# Patient Record
Sex: Female | Born: 1970 | Race: Black or African American | Hispanic: No | Marital: Single | Smoking: Never smoker
Health system: Southern US, Community
[De-identification: ages and names within clinical notes are randomized; demographics above are authoritative.]

## PROBLEM LIST (undated history)

## (undated) DIAGNOSIS — G35 Multiple sclerosis: Secondary | ICD-10-CM

---

## 2020-08-03 ENCOUNTER — Emergency Department (HOSPITAL_COMMUNITY)
Admission: EM | Admit: 2020-08-03 | Discharge: 2020-08-04 | Disposition: A | Discharge status: Home / Self Care | Payer: Medicare Other | Attending: Emergency Medicine | Admitting: Emergency Medicine

## 2020-08-03 ENCOUNTER — Encounter (HOSPITAL_COMMUNITY): Payer: Self-pay | Admitting: Emergency Medicine

## 2020-08-03 ENCOUNTER — Other Ambulatory Visit: Payer: Self-pay

## 2020-08-03 DIAGNOSIS — R110 Nausea: Secondary | ICD-10-CM | POA: Diagnosis present

## 2020-08-03 DIAGNOSIS — R42 Dizziness and giddiness: Secondary | ICD-10-CM | POA: Diagnosis not present

## 2020-08-03 DIAGNOSIS — R200 Anesthesia of skin: Secondary | ICD-10-CM

## 2020-08-03 DIAGNOSIS — G35 Multiple sclerosis: Secondary | ICD-10-CM | POA: Diagnosis not present

## 2020-08-03 HISTORY — DX: Multiple sclerosis: G35

## 2020-08-03 NOTE — ED Triage Notes (Signed)
Patient complaining of nausea, numbness in legs, and dizziness that started today. Patient brought in by [facility].

## 2020-08-04 ENCOUNTER — Encounter (HOSPITAL_COMMUNITY): Payer: Self-pay | Admitting: Emergency Medicine

## 2020-08-04 ENCOUNTER — Emergency Department (HOSPITAL_COMMUNITY): Payer: Medicare Other

## 2020-08-04 LAB — COMPREHENSIVE METABOLIC PANEL
ALT: 19 U/L (ref 0–44)
AST: 26 U/L (ref 15–41)
Albumin: 3.9 g/dL (ref 3.5–5.0)
Alkaline Phosphatase: 93 U/L (ref 38–126)
Anion gap: 9 (ref 5–15)
BUN: 10 mg/dL (ref 6–20)
CO2: 25 mmol/L (ref 22–32)
Calcium: 8.8 mg/dL — ABNORMAL LOW (ref 8.9–10.3)
Chloride: 104 mmol/L (ref 98–111)
Creatinine, Ser: 0.91 mg/dL (ref 0.44–1.00)
GFR, Estimated: >60 mL/min (ref >60)
Glucose, Bld: 74 mg/dL (ref 70–99)
Potassium: 3.2 mmol/L — ABNORMAL LOW (ref 3.5–5.1)
Sodium: 138 mmol/L (ref 135–145)
Total Bilirubin: 0.8 mg/dL (ref 0.3–1.2)
Total Protein: 7.2 g/dL (ref 6.5–8.1)

## 2020-08-04 LAB — CBC
HCT: 38.8 % (ref 36.0–46.0)
Hemoglobin: 12.2 g/dL (ref 12.0–15.0)
MCH: 28.9 pg (ref 26.0–34.0)
MCHC: 31.4 g/dL (ref 30.0–36.0)
MCV: 91.9 fL (ref 80.0–100.0)
Platelets: 223 10*3/uL (ref 150–400)
RBC: 4.22 MIL/uL (ref 3.87–5.11)
RDW: 13.6 % (ref 11.5–15.5)
WBC: 4.6 10*3/uL (ref 4.0–10.5)
nRBC: 0 % (ref 0.0–0.2)

## 2020-08-04 LAB — URINALYSIS, ROUTINE W REFLEX MICROSCOPIC
Bilirubin Urine: NEGATIVE
Glucose, UA: NEGATIVE mg/dL
Hgb urine dipstick: NEGATIVE
Ketones, ur: 5 mg/dL — AB
Leukocytes,Ua: NEGATIVE
Nitrite: NEGATIVE
Protein, ur: NEGATIVE mg/dL
Specific Gravity, Urine: 1.008 (ref 1.005–1.030)
pH: 6 (ref 5.0–8.0)

## 2020-08-04 LAB — HCG, QUANTITATIVE, PREGNANCY: hCG, Beta Chain, Quant, S: 3 m[IU]/mL (ref <5)

## 2020-08-04 IMAGING — MR MR HEAD WO/W CM
10 series · 48 of 48 positions shown · IV contrast (gadavist)
Comparison: Cervical spine the same day reported separately.

CLINICAL DATA: 49-year-old female with a history of multiple
sclerosis, visiting from out of town. Dizziness. Bilateral lower
extremity numbness and nausea.

EXAM:
MRI HEAD WITHOUT AND WITH CONTRAST
TECHNIQUE: Multiplanar, multiecho pulse sequences of the brain and surrounding
structures were obtained without and with intravenous contrast.
CONTRAST:  8mL GADAVIST GADOBUTROL 1 MMOL/ML IV SOLN in conjunction
with contrast enhanced imaging of the cervical spine reported
separately.

[Series 9: DWI · axial · 3.0mm · 1.36mm/px · z∈[-95,+57]mm · 9 of 104 slices shown (1 of 2)]
[im 1/104]
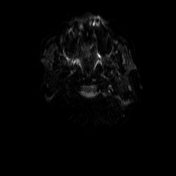
[im 13/104]
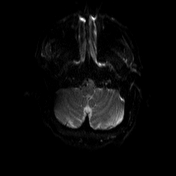
[im 26/104]
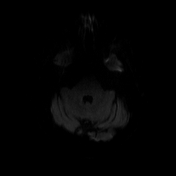
[im 39/104]
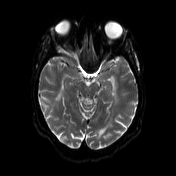
[im 52/104]
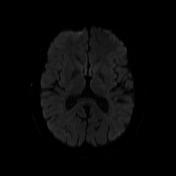
[im 65/104]
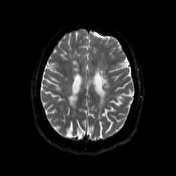
[im 78/104]
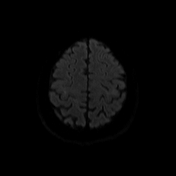
[im 91/104]
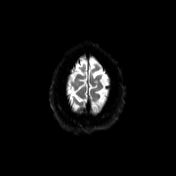
[im 104/104]
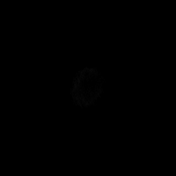

[Series 10: DWI · axial · 3.0mm · 1.36mm/px · z∈[-95,+57]mm · 4 of 52 slices shown (2 of 2)]
[im 1/52]
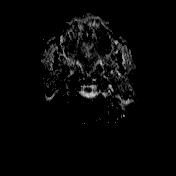
[im 18/52]
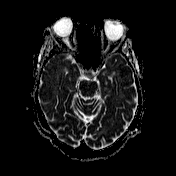
[im 35/52]
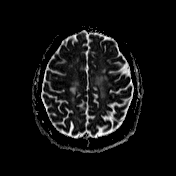
[im 52/52]
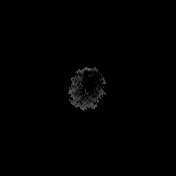

[Series 11: T1 · sagittal · 5.0mm · 0.75mm/px · 2 of 26 slices shown (1 of 2)]
[im 1/26]
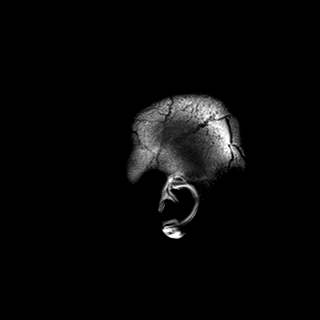
[im 26/26]
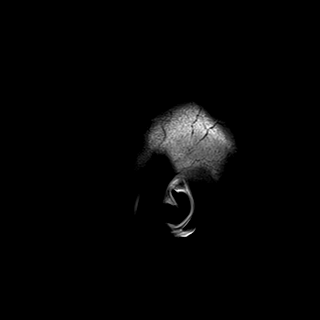

[Series 12: T2 · axial · 5.0mm · 0.62mm/px · z∈[-84,+65]mm · 2 of 24 slices shown (1 of 2)]
[im 1/24]
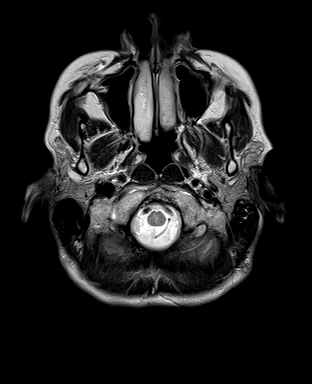
[im 24/24]
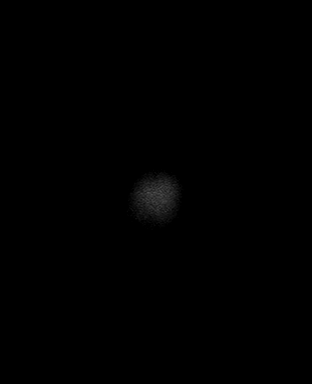

[Series 13: swi_images · axial · 3.0mm · 0.75mm/px · z∈[-86,+67]mm · 4 of 52 slices shown]
[im 1/52]
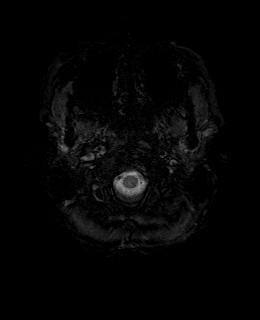
[im 18/52]
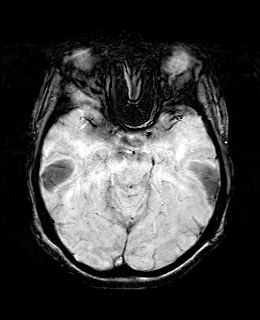
[im 35/52]
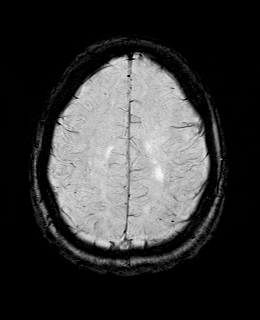
[im 52/52]
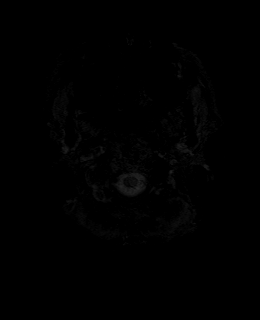

[Series 15: FLAIR · axial · 3.0mm · 0.75mm/px · z∈[-99,+59]mm · 4 of 54 slices shown]
[im 1/54]
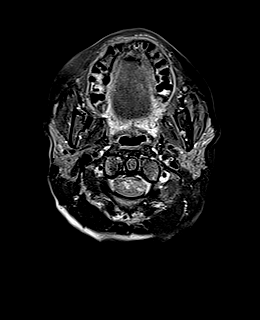
[im 18/54]
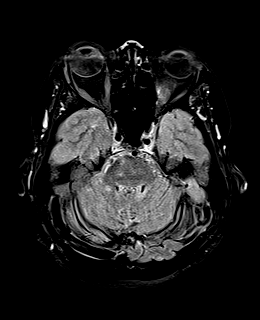
[im 36/54]
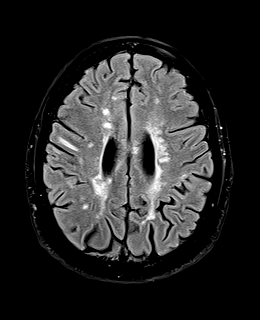
[im 54/54]
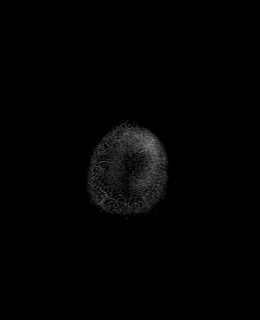

[Series 16: T1 · axial · 1.0mm · 0.94mm/px · z∈[-94,+64]mm · 13 of 160 slices shown (2 of 2)]
[im 1/160]
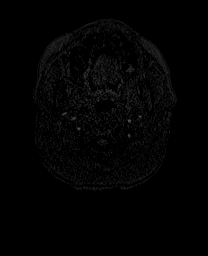
[im 14/160]
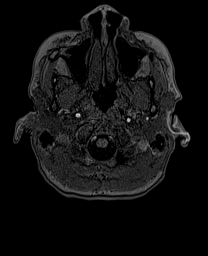
[im 27/160]
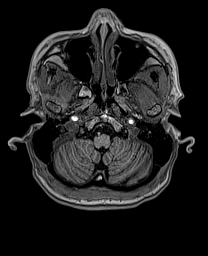
[im 40/160]
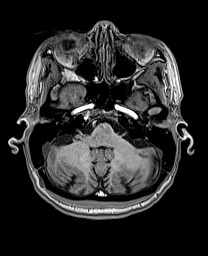
[im 54/160]
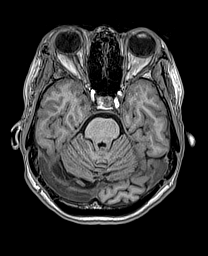
[im 67/160]
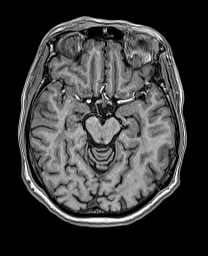
[im 80/160]
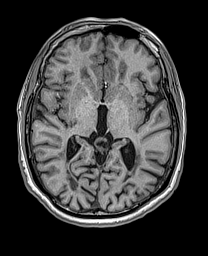
[im 93/160]
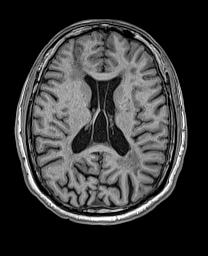
[im 107/160]
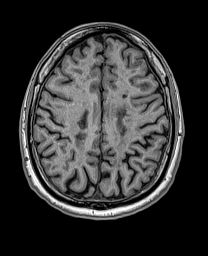
[im 120/160]
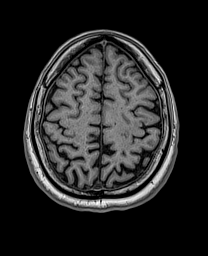
[im 133/160]
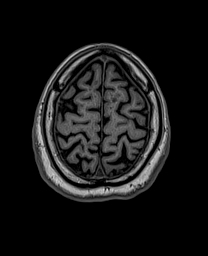
[im 146/160]
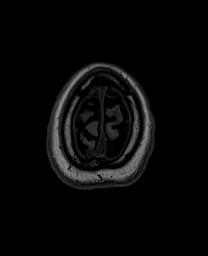
[im 160/160]
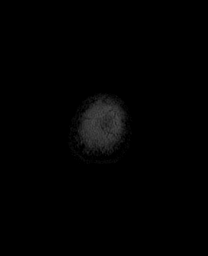

[Series 17: cor dwi_tracew · coronal · 5.0mm · 1.53mm/px · 5 of 56 slices shown]
[im 1/56]
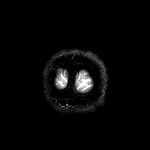
[im 14/56]
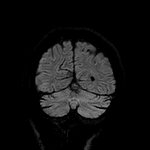
[im 28/56]
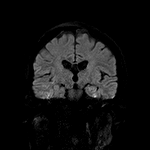
[im 42/56]
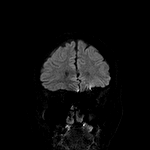
[im 56/56]
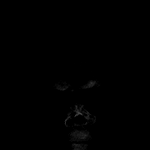

[Series 18: cor dwi_adc · coronal · 5.0mm · 1.53mm/px · 2 of 28 slices shown]
[im 1/28]
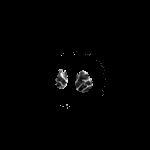
[im 28/28]
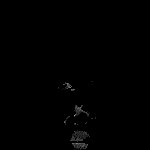

[Series 19: T2 · coronal · 5.0mm · 0.57mm/px · 3 of 37 slices shown (2 of 2)]
[im 1/37]
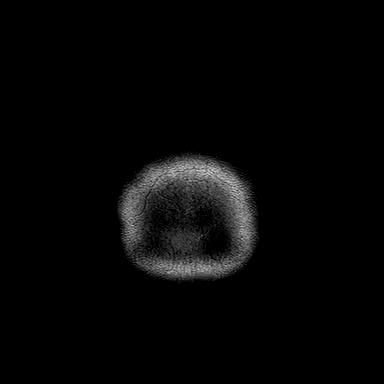
[im 19/37]
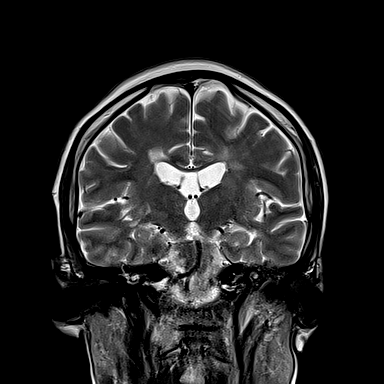
[im 37/37]
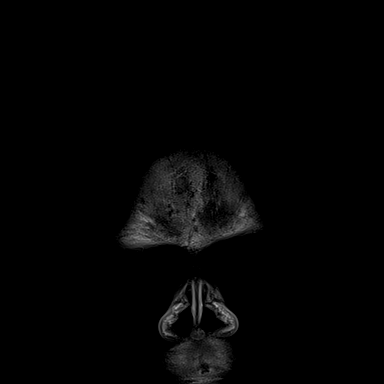

[48 of 48 positions shown; findings below may reference images not displayed]

FINDINGS: Brain: Cerebral white matter volume loss including throughout the
corpus callosum (series 11, image 13). Extensive bilateral cerebral
white matter T2 and FLAIR hyperintensity, confluent in the
periventricular regions but also involving scattered subcortical
white matter, and there is occasional direct involvement of the
cortex (series 15, image 43 left posterior frontal lobe).
Heterogeneous involvement of the left deep gray matter nuclei.
Heterogeneous involvement of the brainstem, also more pronounced on
the left. Patchy cerebellar involvement also greater on the left and
involving some of the left cerebellar peduncles.

Occasional faint T2 shine through on diffusion. No diffusion
restricted lesion. No abnormal enhancement identified.

Cavum septum pellucidum, normal variant. No superimposed restricted
diffusion suggestive of acute infarction. No midline shift, mass
effect, evidence of mass lesion, ventriculomegaly, extra-axial
collection or acute intracranial hemorrhage. Cervicomedullary
junction and pituitary are within normal limits. No chronic cerebral
blood products identified. No dural thickening.

Vascular: Major intracranial vascular flow voids are preserved. The
major dural venous sinuses are enhancing and appear to be patent.

Skull and upper cervical spine: Visualized bone marrow signal is
within normal limits. Cervical spine detailed separately.

Sinuses/Orbits: Grossly symmetric orbits. Paranasal sinuses are
clear.

Other: Trace mastoid fluid on the left. Negative visible
nasopharynx. Negative visible scalp and face.
IMPRESSION: 1. Advanced chronic demyelinating disease. No active demyelination
or other acute intracranial abnormality identified.
2. Cervical Spine MRI reported separately.

## 2020-08-04 IMAGING — CT CT HEAD W/O CM
3 of 9 series · 15 of 47 positions shown, 18 images · non-contrast
Comparison: None.

CLINICAL DATA: Multiple sclerosis. Patient complaining of nausea,
numbness in legs, and dizziness that started today. Patient brought
in by [REDACTED] Repeats scans for pt motion.

EXAM:
CT HEAD WITHOUT CONTRAST
TECHNIQUE: Contiguous axial images were obtained from the base of the skull
through the vertex without intravenous contrast.

[Series 4: head wo · axial · 0.47mm/px · z∈[+1566,+1700]mm · 12 of 33 slices shown, 15 images]
[im 3/33  brain]
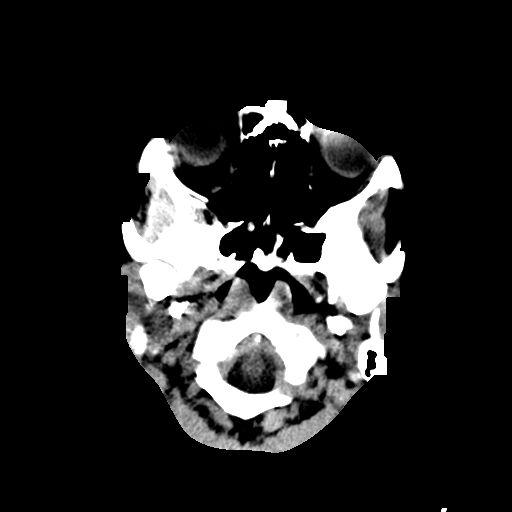
[im 3/33  bone]
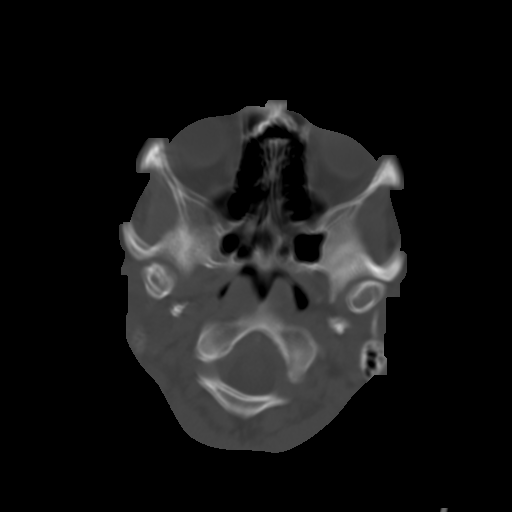
[im 5/33  brain]
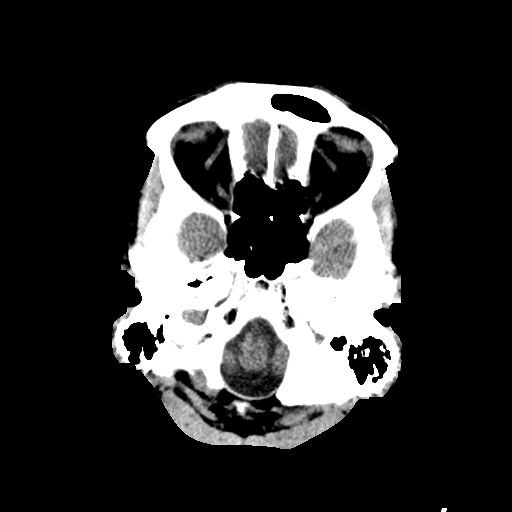
[im 7/33  brain]
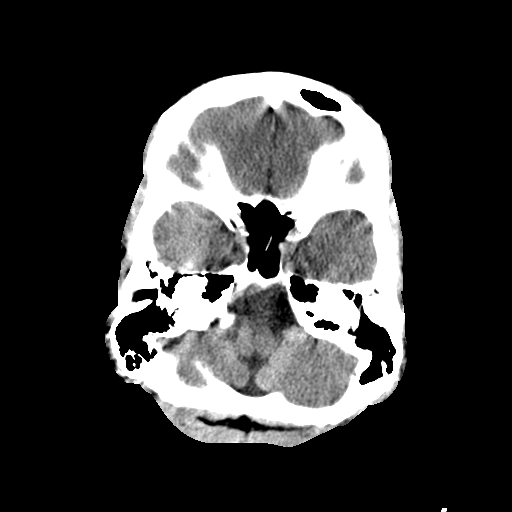
[im 11/33  brain]
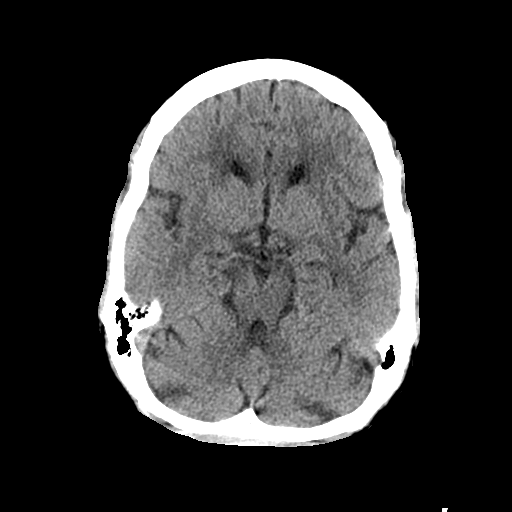
[im 13/33  brain]
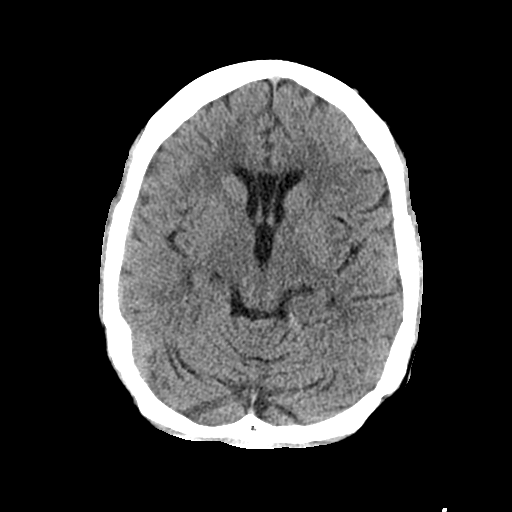
[im 13/33  bone]
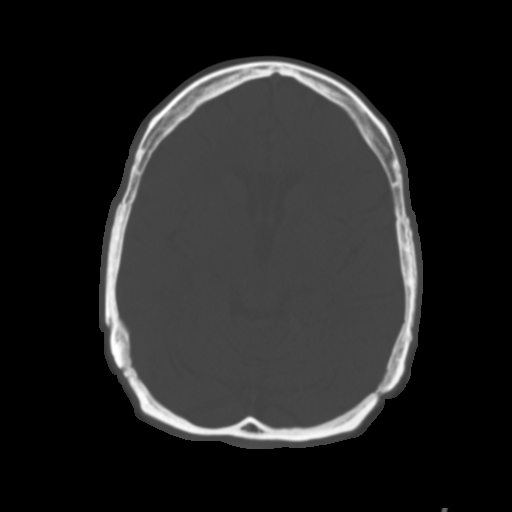
[im 15/33  brain]
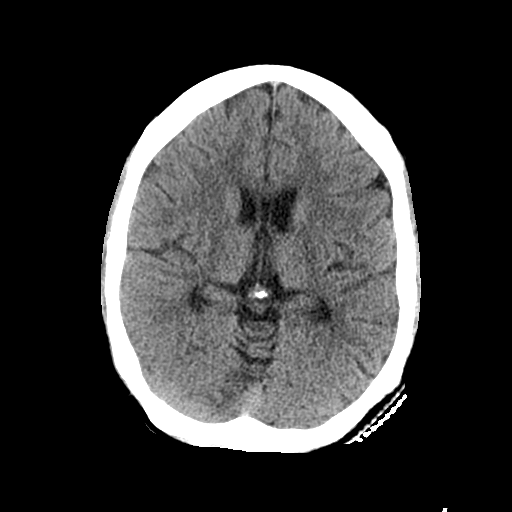
[im 18/33  brain]
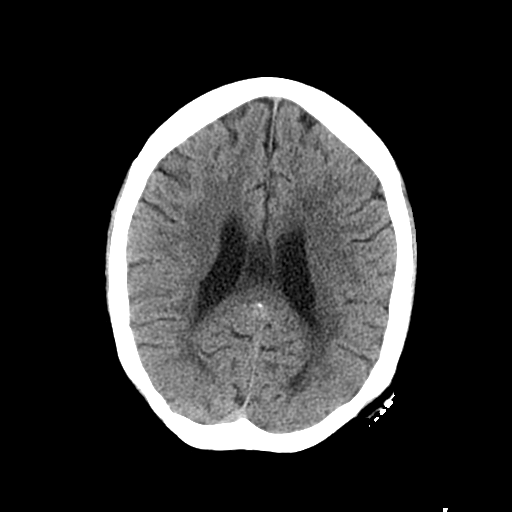
[im 20/33  brain]
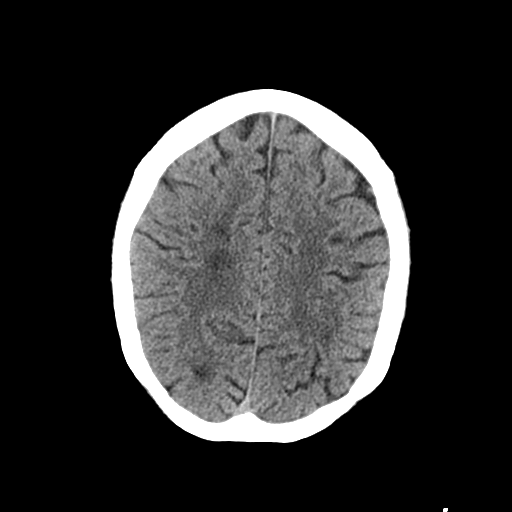
[im 22/33  brain]
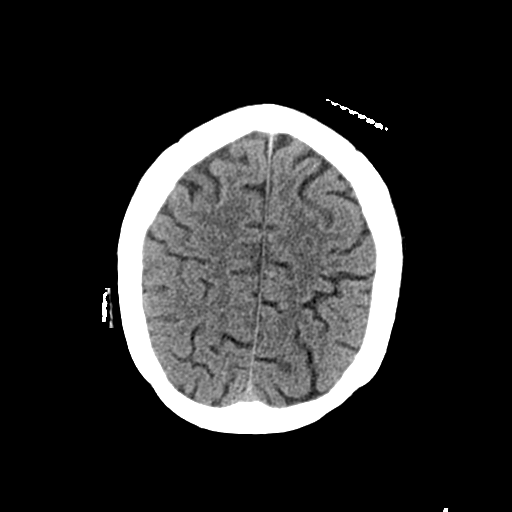
[im 22/33  bone]
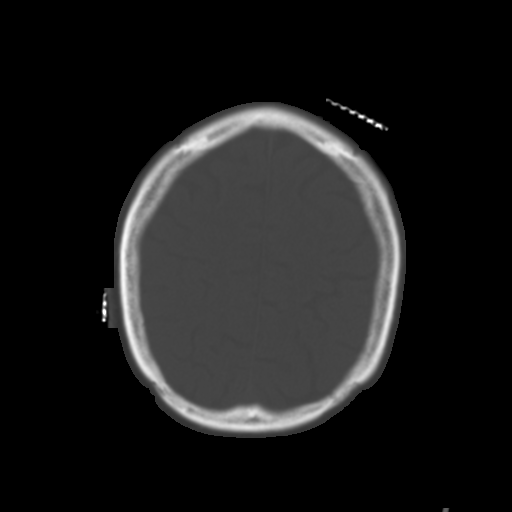
[im 26/33  brain]
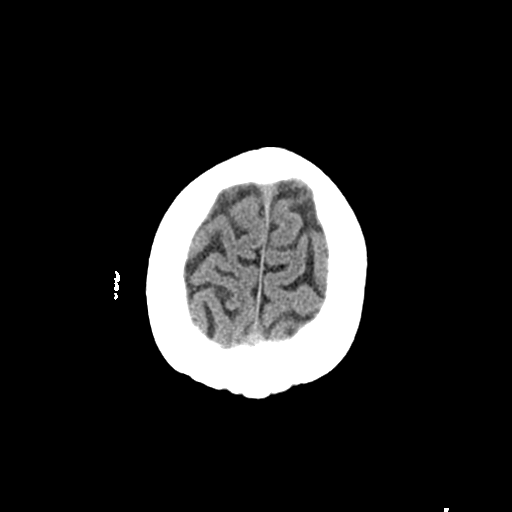
[im 28/33  brain]
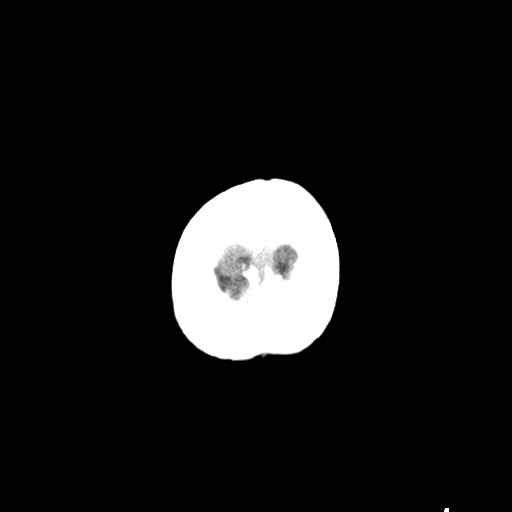
[im 30/33  brain]
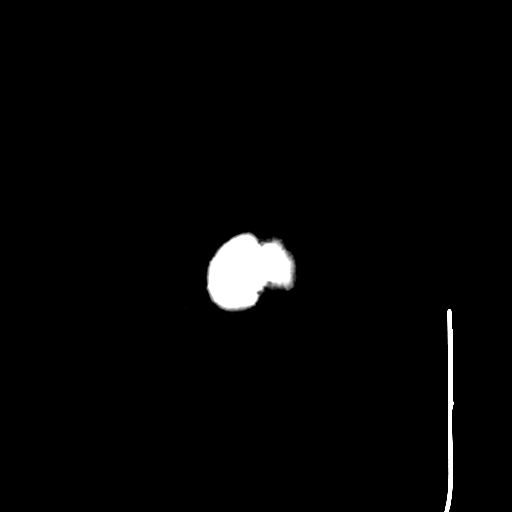

[Series 11: coronal soft tissue · coronal · 0.34mm/px · 2 of 70 slices shown]
[im 24/70  brain]
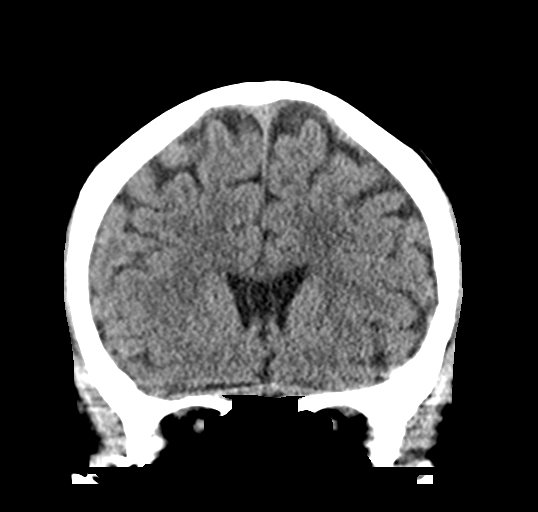
[im 47/70  brain]
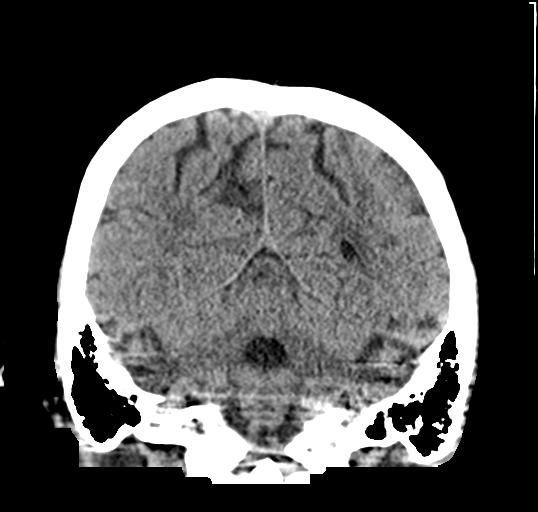

[Series 12: sagittal soft tissue · sagittal · 0.34mm/px · 1 of 61 slices shown]
[im 31/61  brain]
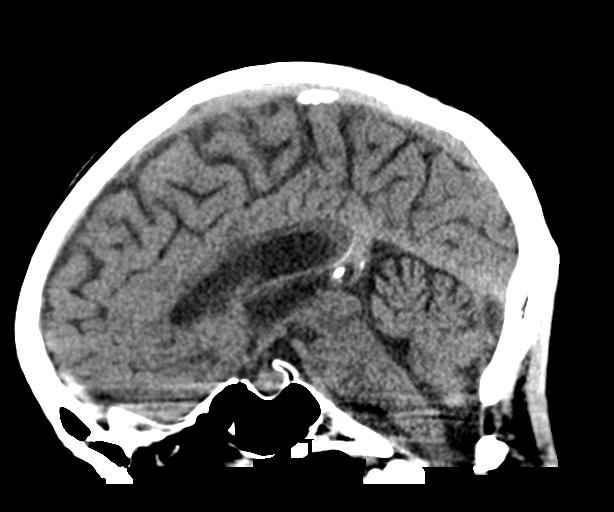

[15 of 47 positions shown; findings below may reference images not displayed]

FINDINGS: Brain:

Patchy areas of decreased attenuation are noted along
periventricular white matter of the cerebral hemispheres
bilaterally.

No evidence of large-territorial acute infarction. No parenchymal
hemorrhage. No mass lesion. No extra-axial collection. Cavum septum
pellucidum variant.

No mass effect or midline shift. No hydrocephalus. Basilar cisterns
are patent.

Vascular: No hyperdense vessel.

Skull: No acute fracture or focal lesion.

Sinuses/Orbits: Paranasal sinuses and mastoid air cells are clear.
The orbits are unremarkable.

Other: None.
IMPRESSION: No acute intracranial abnormality.

## 2020-08-04 IMAGING — MR MR HEAD WO/W CM
4 series · 48 of 48 positions shown · IV contrast (gadavist)
Comparison: Cervical spine the same day reported separately.

CLINICAL DATA: 49-year-old female with a history of multiple
sclerosis, visiting from out of town. Dizziness. Bilateral lower
extremity numbness and nausea.

EXAM:
MRI HEAD WITHOUT AND WITH CONTRAST
TECHNIQUE: Multiplanar, multiecho pulse sequences of the brain and surrounding
structures were obtained without and with intravenous contrast.
CONTRAST:  8mL GADAVIST GADOBUTROL 1 MMOL/ML IV SOLN in conjunction
with contrast enhanced imaging of the cervical spine reported
separately.

[Series 5: T2 post-contrast · coronal · 5.0mm · 0.57mm/px · 6 of 29 slices shown]
[im 1/29]
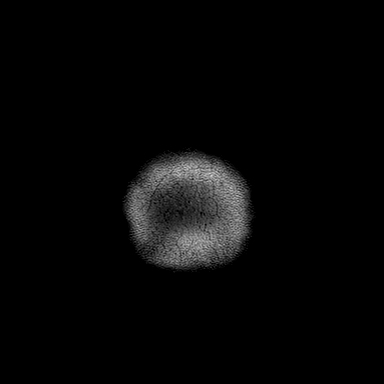
[im 6/29]
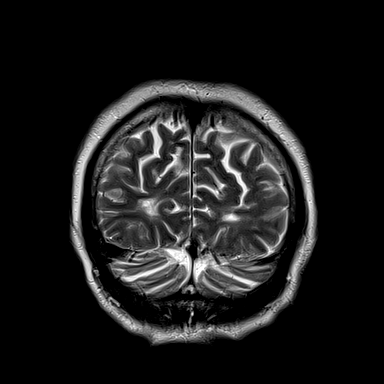
[im 12/29]
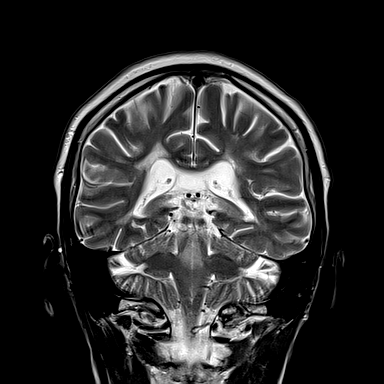
[im 17/29]
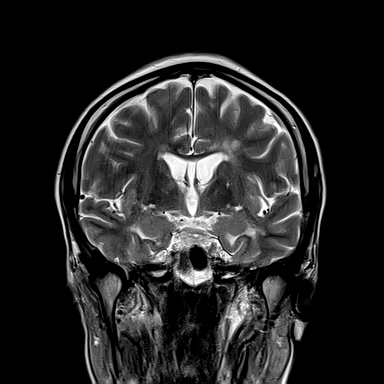
[im 23/29]
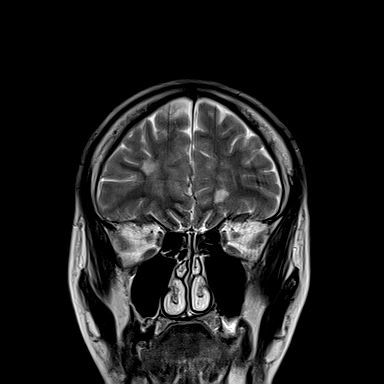
[im 29/29]
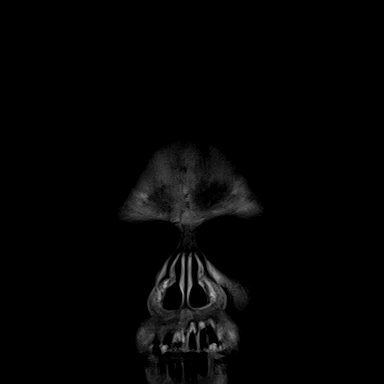

[Series 6: T1 post-contrast · axial · 1.0mm · 0.94mm/px · z∈[-99,+59]mm · 31 of 160 slices shown (1 of 3)]
[im 1/160]
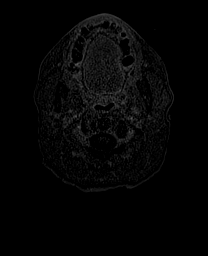
[im 6/160]
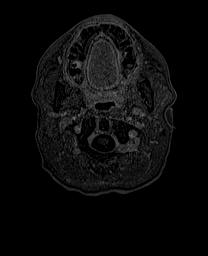
[im 11/160]
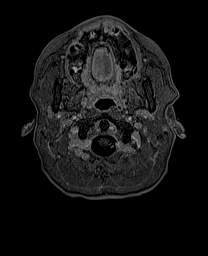
[im 16/160]
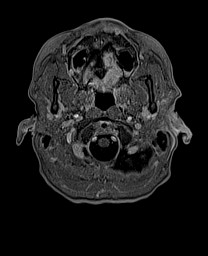
[im 22/160]
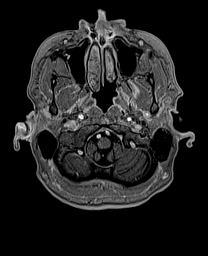
[im 27/160]
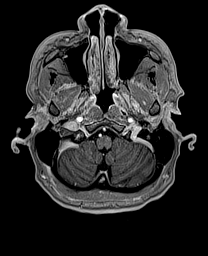
[im 32/160]
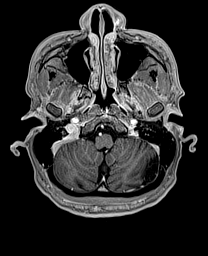
[im 38/160]
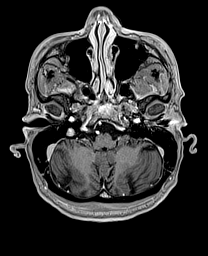
[im 43/160]
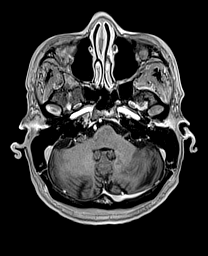
[im 48/160]
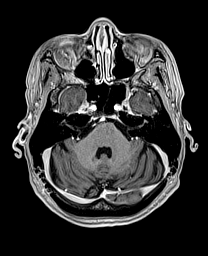
[im 54/160]
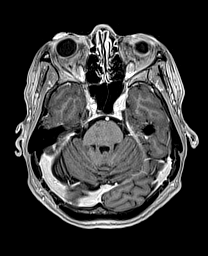
[im 59/160]
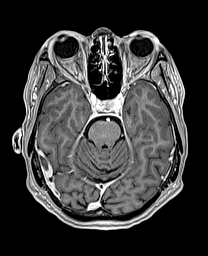
[im 64/160]
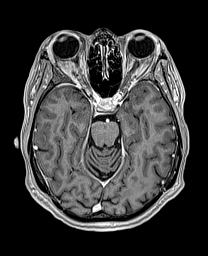
[im 69/160]
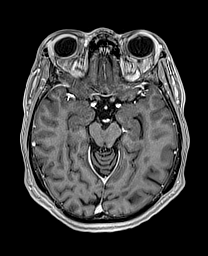
[im 75/160]
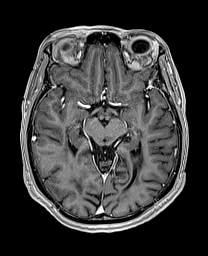
[im 80/160]
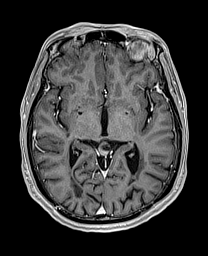
[im 85/160]
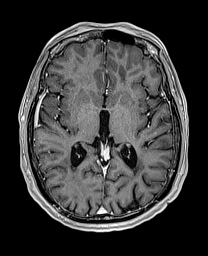
[im 91/160]
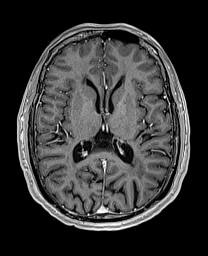
[im 96/160]
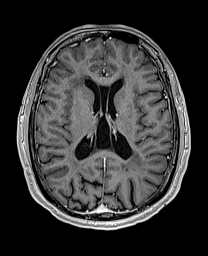
[im 101/160]
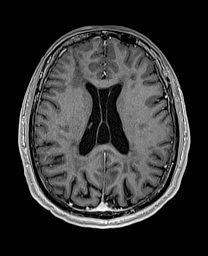
[im 107/160]
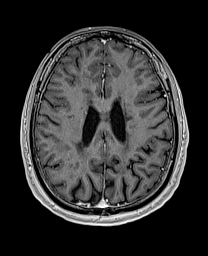
[im 112/160]
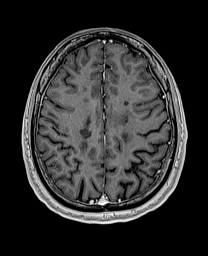
[im 117/160]
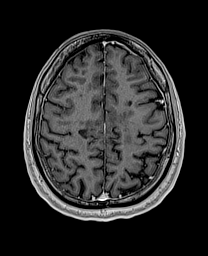
[im 122/160]
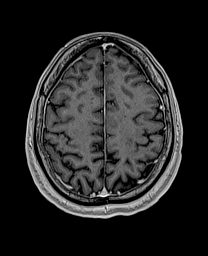
[im 128/160]
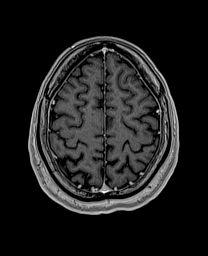
[im 133/160]
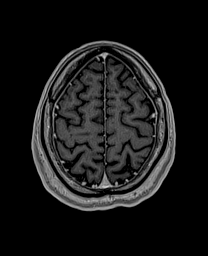
[im 138/160]
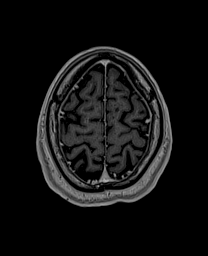
[im 144/160]
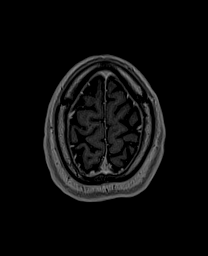
[im 149/160]
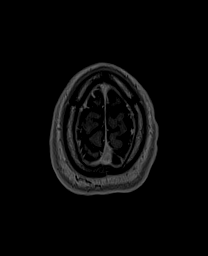
[im 154/160]
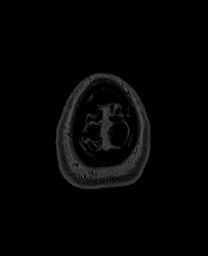
[im 160/160]
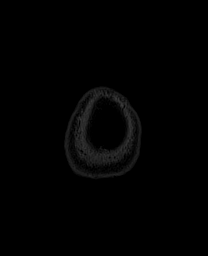

[Series 7: T1 post-contrast · coronal · 5.0mm · 0.43mm/px · 6 of 29 slices shown (2 of 3)]
[im 1/29]
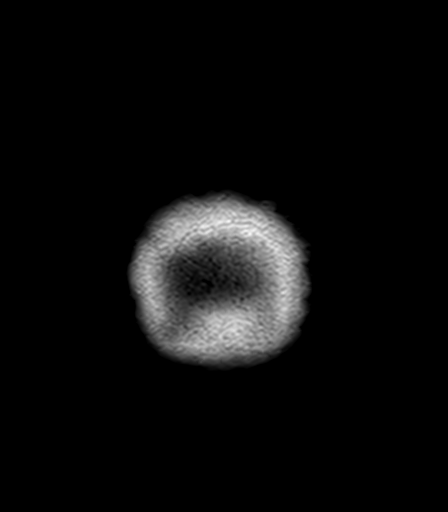
[im 6/29]
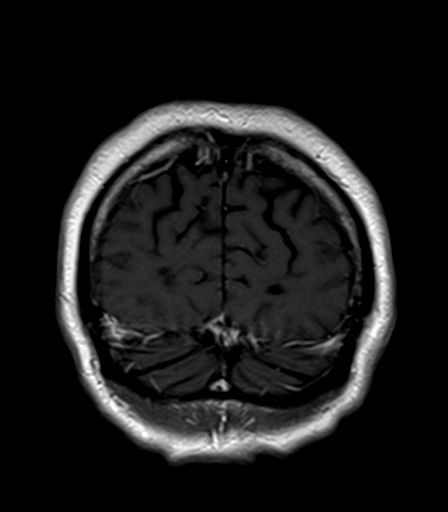
[im 12/29]
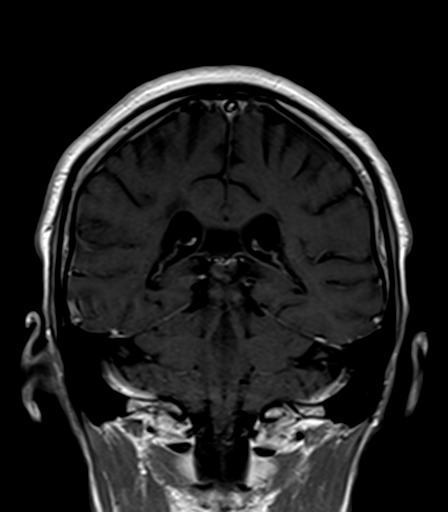
[im 17/29]
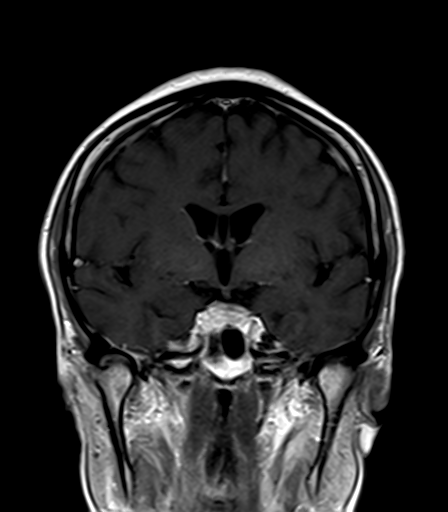
[im 23/29]
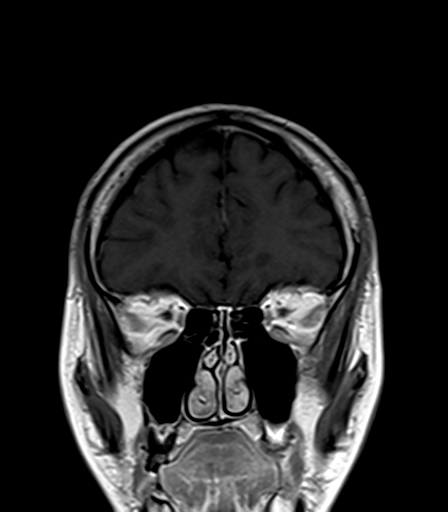
[im 29/29]
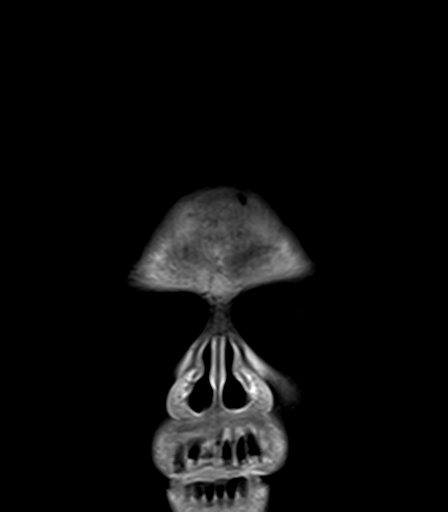

[Series 8: T1 post-contrast · sagittal · 5.0mm · 0.75mm/px · 5 of 26 slices shown (3 of 3)]
[im 1/26]
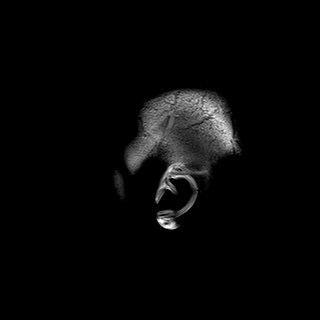
[im 7/26]
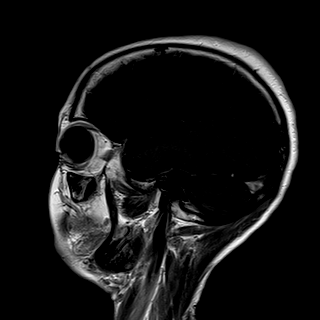
[im 13/26]
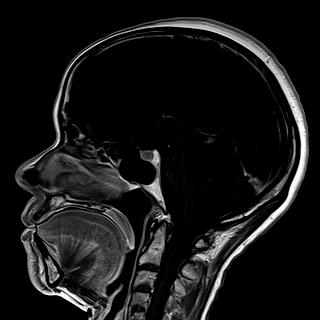
[im 19/26]
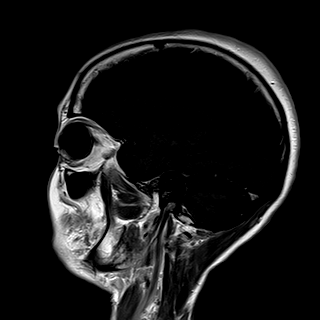
[im 26/26]
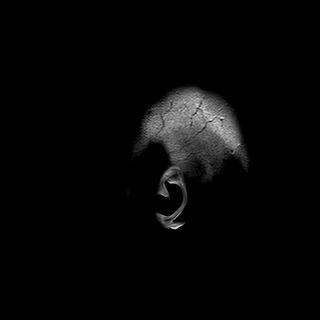

[48 of 48 positions shown; findings below may reference images not displayed]

FINDINGS: Brain: Cerebral white matter volume loss including throughout the
corpus callosum (series 11, image 13). Extensive bilateral cerebral
white matter T2 and FLAIR hyperintensity, confluent in the
periventricular regions but also involving scattered subcortical
white matter, and there is occasional direct involvement of the
cortex (series 15, image 43 left posterior frontal lobe).
Heterogeneous involvement of the left deep gray matter nuclei.
Heterogeneous involvement of the brainstem, also more pronounced on
the left. Patchy cerebellar involvement also greater on the left and
involving some of the left cerebellar peduncles.

Occasional faint T2 shine through on diffusion. No diffusion
restricted lesion. No abnormal enhancement identified.

Cavum septum pellucidum, normal variant. No superimposed restricted
diffusion suggestive of acute infarction. No midline shift, mass
effect, evidence of mass lesion, ventriculomegaly, extra-axial
collection or acute intracranial hemorrhage. Cervicomedullary
junction and pituitary are within normal limits. No chronic cerebral
blood products identified. No dural thickening.

Vascular: Major intracranial vascular flow voids are preserved. The
major dural venous sinuses are enhancing and appear to be patent.

Skull and upper cervical spine: Visualized bone marrow signal is
within normal limits. Cervical spine detailed separately.

Sinuses/Orbits: Grossly symmetric orbits. Paranasal sinuses are
clear.

Other: Trace mastoid fluid on the left. Negative visible
nasopharynx. Negative visible scalp and face.
IMPRESSION: 1. Advanced chronic demyelinating disease. No active demyelination
or other acute intracranial abnormality identified.
2. Cervical Spine MRI reported separately.

## 2020-08-04 IMAGING — MR MR CERVICAL SPINE WO/W CM
7 of 8 series · 33 of 48 positions shown · IV contrast (gadavist)
Comparison: None.

CLINICAL DATA: 49-year-old female with a history of multiple
sclerosis, visiting from out of town. Dizziness. Bilateral lower
extremity numbness and nausea.

EXAM:
MRI CERVICAL SPINE WITHOUT AND WITH CONTRAST
TECHNIQUE: Multiplanar and multiecho pulse sequences of the cervical spine, to
include the craniocervical junction and cervicothoracic junction,
were obtained without and with intravenous contrast.
CONTRAST:  8mL GADAVIST GADOBUTROL 1 MMOL/ML IV SOLN

[Series 5: T1 · sagittal · 3.0mm · 0.69mm/px · 4 of 15 slices shown (1 of 2)]
[im 1/15]
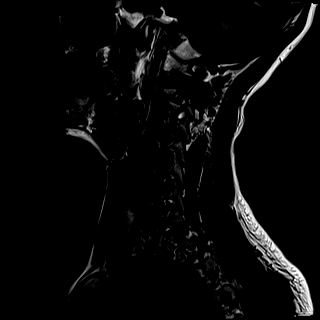
[im 5/15]
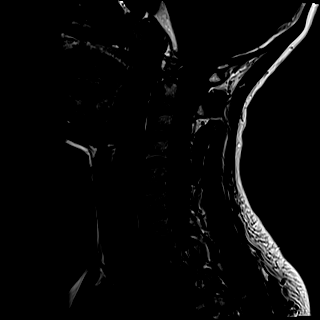
[im 10/15]
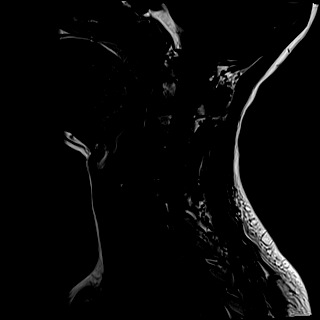
[im 15/15]
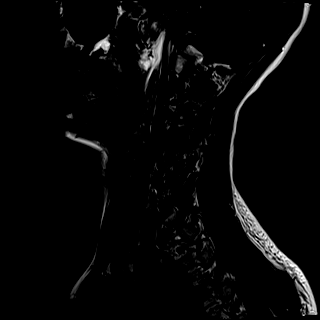

[Series 6: STIR · sagittal · 3.0mm · 0.86mm/px · 4 of 15 slices shown]
[im 1/15]
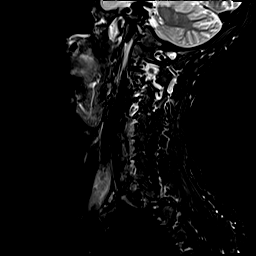
[im 5/15]
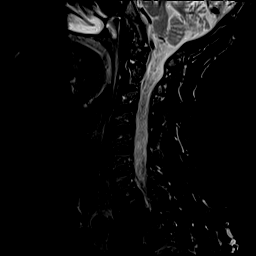
[im 10/15]
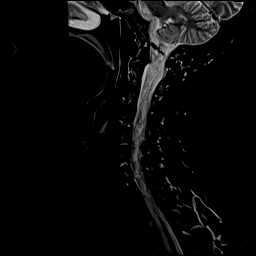
[im 15/15]
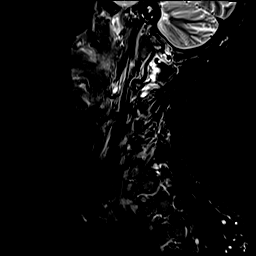

[Series 7: T2 · axial · 3.0mm · 0.70mm/px · z∈[-59,+57]mm · 8 of 34 slices shown]
[im 1/34]
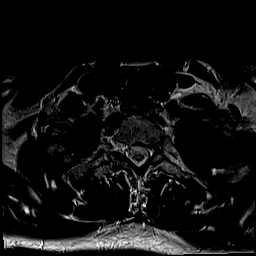
[im 5/34]
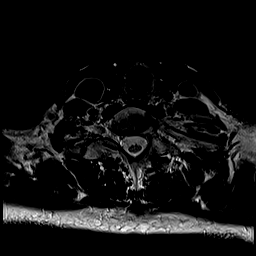
[im 10/34]
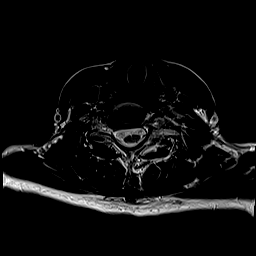
[im 15/34]
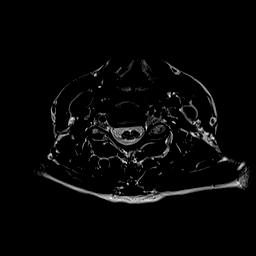
[im 19/34]
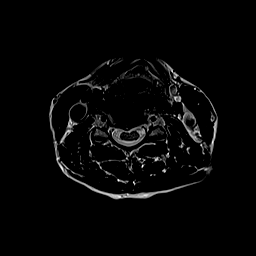
[im 24/34]
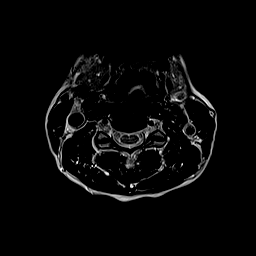
[im 29/34]
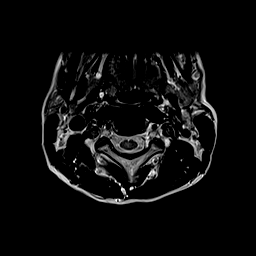
[im 34/34]
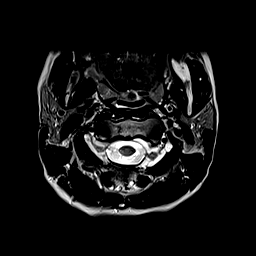

[Series 9: T1 · axial · 3.0mm · 0.35mm/px · z∈[-59,+57]mm · 8 of 34 slices shown (2 of 2)]
[im 1/34]
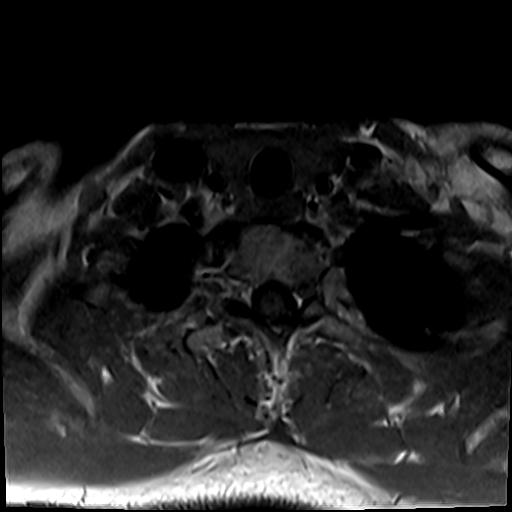
[im 5/34]
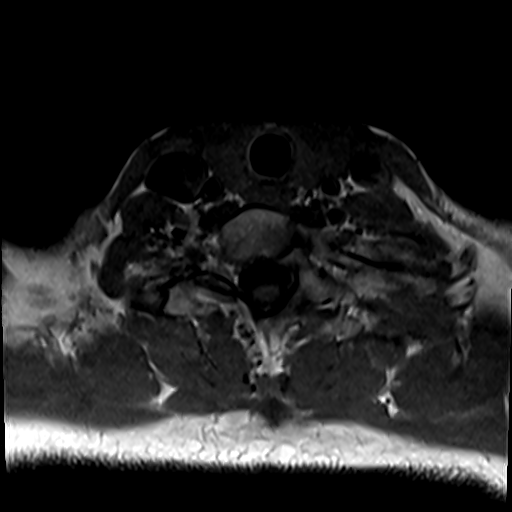
[im 10/34]
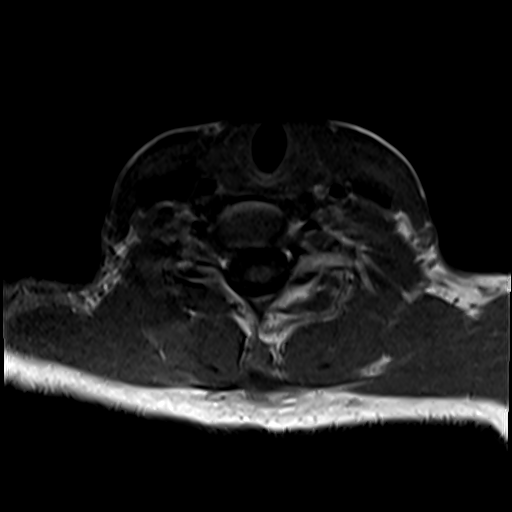
[im 15/34]
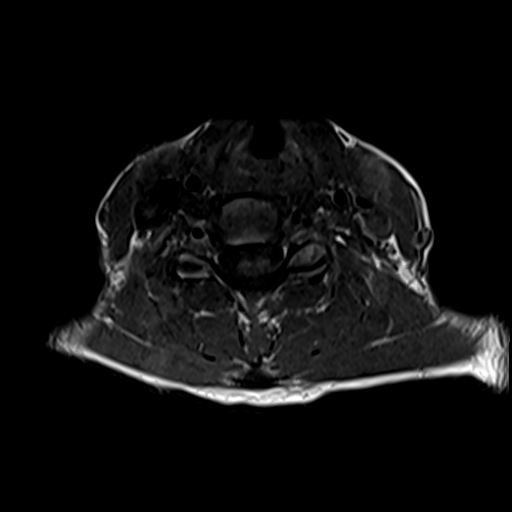
[im 19/34]
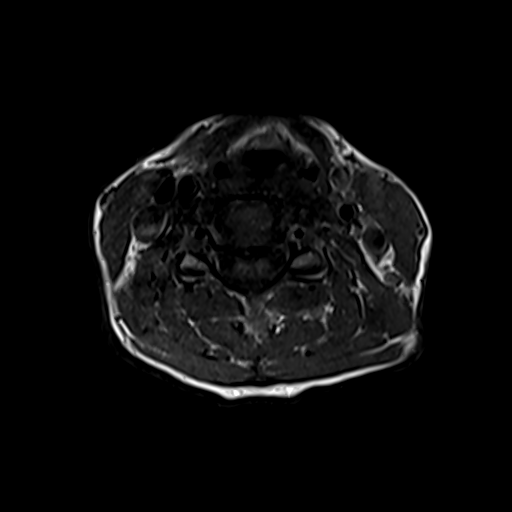
[im 24/34]
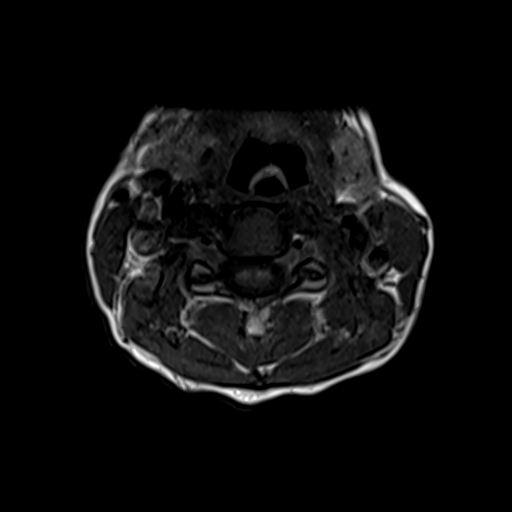
[im 29/34]
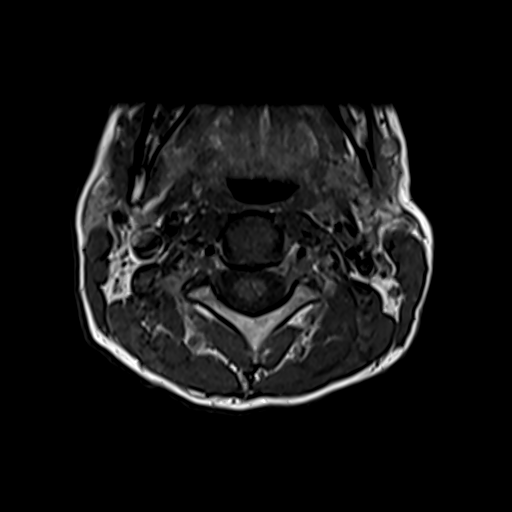
[im 34/34]
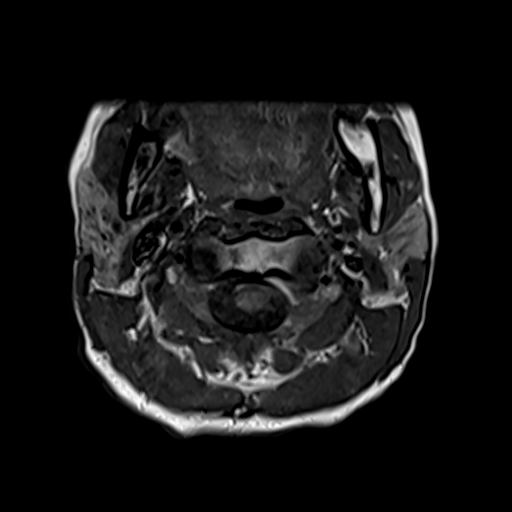

[Series 11: T1 fat-sat post-contrast · sagittal · 3.0mm · 0.69mm/px · 4 of 15 slices shown]
[im 1/15]
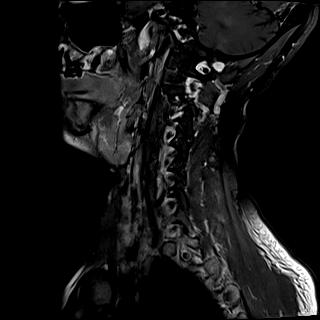
[im 5/15]
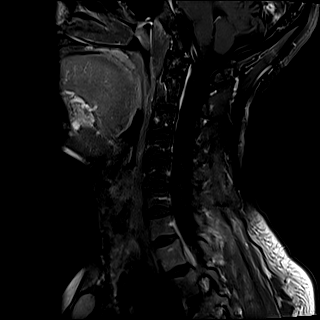
[im 10/15]
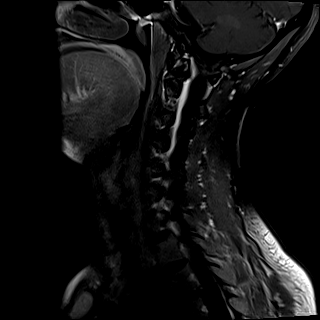
[im 15/15]
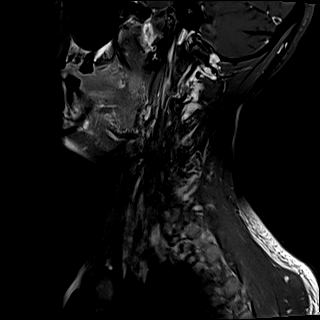

[Series 12: T1 post-contrast · axial · 3.0mm · 0.35mm/px · 1 of 34 slices shown]
[im 1/34]
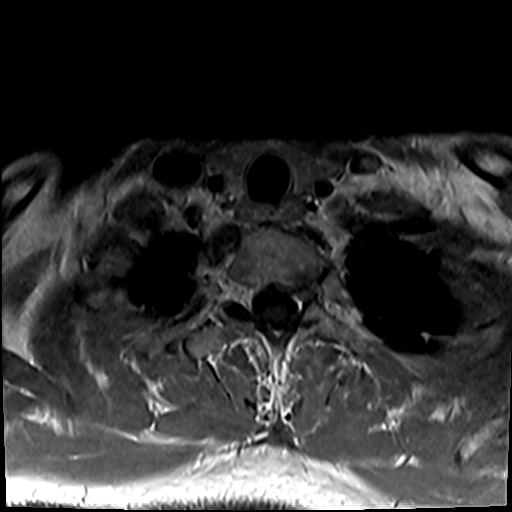

[Series 13: T2 post-contrast · sagittal · 3.0mm · 0.69mm/px · 4 of 15 slices shown]
[im 1/15]
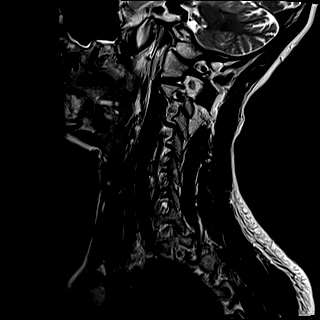
[im 5/15]
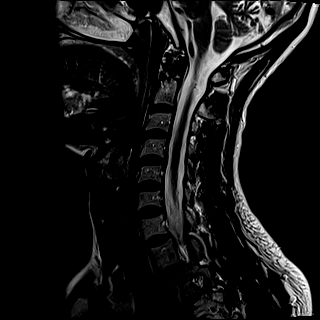
[im 10/15]
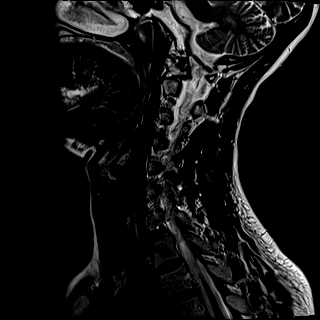
[im 15/15]
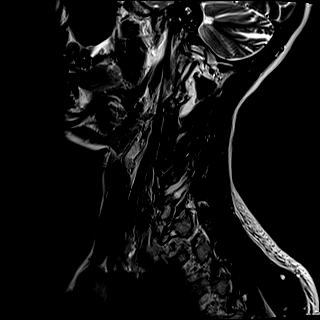

[33 of 48 positions shown; findings below may reference images not displayed]

FINDINGS: Alignment: Preserved cervical lordosis.

Vertebrae: No marrow edema or evidence of acute osseous abnormality.
Visualized bone marrow signal is within normal limits.

Cord: Fairly widespread heterogeneous, patchy T2 and STIR
hyperintensity. Involvement within the upper cervical spinal cord
involvement is eccentric to the right at the C2 level with some
dorsal column involvement (series 7, image 4, series 8, image 2).
Additional right hemi cord involvement suspected at C3-C4. Partially
visible indistinct ventral cord signal abnormality in the upper
thoracic spine at T2-T3 (series 6, image 9).

No cord expansion. No abnormal intradural enhancement. No dural
thickening.

Posterior Fossa, vertebral arteries, paraspinal tissues: Brain MRI
today is pending. There is some heterogeneous signal in the visible
pons, pontomedullary junction, and central left cerebellum and left
cerebellar peduncle. No visible intracranial enhancement.

Preserved major vascular flow voids in the neck. Tortuous right
vertebral artery. Negative visible neck soft tissues. Negative
visible lung apices.

Disc levels:

Capacious cervical spinal canal with mild for age cervical spine
degeneration.

At C4-C5 there is a right paracentral disc protrusion with annular
fissure (series 7, image 16) narrowing the ventral CSF space but
without spinal stenosis.

Tortuous right vertebral artery in the right C4 neural foramen.

No spinal or foraminal stenosis.
IMPRESSION: 1. Fairly extensive signal heterogeneity compatible with chronic
demyelinating disease in the cervical and visible upper thoracic
spinal cord. No active demyelination.
2. Partially visible abnormal signal in the brainstem and left
cerebellum. Dedicated Brain MRI pending at this time.
3. Minimal cervical spine degeneration. No degenerative neural
impingement.

## 2020-08-04 MED ORDER — GADOBUTROL 1 MMOL/ML IV SOLN
8.0000 mL | Freq: Once | INTRAVENOUS | Status: AC | PRN
  Administered 2020-08-04: 8 mL via INTRAVENOUS

## 2020-08-04 MED ORDER — POTASSIUM CHLORIDE CRYS ER 20 MEQ PO TBCR
40.0000 meq | EXTENDED_RELEASE_TABLET | Freq: Once | ORAL | Status: AC
  Administered 2020-08-04: 40 meq via ORAL
  Filled 2020-08-04: qty 2

## 2020-08-04 MED ORDER — ONDANSETRON 8 MG PO TBDP
8.0000 mg | ORAL_TABLET | Freq: Once | ORAL | Status: AC
  Administered 2020-08-04: 8 mg via ORAL
  Filled 2020-08-04: qty 1

## 2020-08-04 NOTE — Care Management (Signed)
Consult in for DME [facility] patient from [place], staying with a friend here. She will come to pick her up, no needs identified

## 2020-08-04 NOTE — Discharge Instructions (Addendum)
Continue taking your usual medicines as prescribed.  Follow-up with your doctor as planned.

## 2020-08-04 NOTE — ED Provider Notes (Addendum)
[facility] Note   CSN: [id number] Arrival date & time: 08/03/20  2247     History Chief Complaint  Patient presents with   Nausea    Emma Gill is a 50 y.o. female.  The history is provided by the patient.  Illness Location:  Patient presents with numbness in B feet and lightheadness and nausea after taking a train ride here from [place] Quality:  Numbness of the feet, symmetrically Severity:  Moderate Onset quality:  Sudden Duration:  1 day Timing:  Constant Progression:  Unchanged Chronicity:  New Context:  Patient with MS, visiting from [place] Relieved by:  Nothing Worsened by:  Nothing Ineffective treatments:  None tried Associated symptoms: no chest pain, no cough, no diarrhea, no fever, no loss of consciousness, no rash, no shortness of breath, no vomiting and no wheezing   Risk factors:  MS Patient with MS on [employer] came to visit [place] (from [place]) on a train and now has nausea, lightheadedness and numb feet.  No weakness, no vision changes no vomiting.      Past Medical History:  Diagnosis Date   Multiple sclerosis (HCC)     There are no problems to display for this patient.   History reviewed. No pertinent surgical history.   OB History   No obstetric history on file.     History reviewed. No pertinent family history.  Social History   Tobacco Use   Smoking status: Never   Smokeless tobacco: Never  Vaping Use   Vaping Use: Never used  Substance Use Topics   Alcohol use: Not Currently   Drug use: Not Currently    Home Medications Prior to Admission medications   Not on File    Allergies    Patient has no known allergies.  Review of Systems   Review of Systems  Constitutional:  Negative for fever.  HENT:  Negative for facial swelling.   Eyes:  Negative for redness.  Respiratory:  Negative for cough, shortness of breath and wheezing.   Cardiovascular:  Negative for  chest pain.  Gastrointestinal:  Negative for diarrhea and vomiting.  Musculoskeletal:  Negative for neck stiffness.  Skin:  Negative for rash.  Neurological:  Positive for light-headedness. Negative for loss of consciousness and syncope.  Psychiatric/Behavioral:  Negative for agitation.   All other systems reviewed and are negative.  Physical Exam Updated Vital Signs BP (!) 107/46   Pulse 69   Temp 98.8 F (37.1 C) (Oral)   Resp 16   Ht 6' (1.829 m)   Wt 81.6 kg   SpO2 95%   BMI 24.41 kg/m   Physical Exam Vitals and nursing note reviewed.  Constitutional:      General: She is not in acute distress.    Appearance: Normal appearance.  HENT:     Head: Normocephalic and atraumatic.     Nose: Nose normal.  Eyes:     Conjunctiva/sclera: Conjunctivae normal.     Pupils: Pupils are equal, round, and reactive to light.  Cardiovascular:     Rate and Rhythm: Normal rate and regular rhythm.     Pulses: Normal pulses.     Heart sounds: Normal heart sounds.  Pulmonary:     Effort: Pulmonary effort is normal.     Breath sounds: Normal breath sounds.  Abdominal:     General: Abdomen is flat. Bowel sounds are normal.     Palpations: Abdomen is soft.     Tenderness: There  is no abdominal tenderness. There is no guarding.  Musculoskeletal:        General: Normal range of motion.     Cervical back: Normal range of motion and neck supple.  Skin:    General: Skin is warm and dry.  Neurological:     Mental Status: She is alert.     Deep Tendon Reflexes: Reflexes normal.     Comments: Diminished sensation of B feet, sensation above it feet is intact B  Psychiatric:        Mood and Affect: Mood normal.        Behavior: Behavior normal.    ED Results / Procedures / Treatments   Labs (all labs ordered are listed, but only abnormal results are displayed) Results for orders placed or performed during the hospital encounter of 08/03/20  Comprehensive metabolic panel  Result Value Ref  Range   Sodium 138 135 - 145 mmol/L   Potassium 3.2 (L) 3.5 - 5.1 mmol/L   Chloride 104 98 - 111 mmol/L   CO2 25 22 - 32 mmol/L   Glucose, Bld 74 70 - 99 mg/dL   BUN 10 6 - 20 mg/dL   Creatinine, Ser 6.38 0.44 - 1.00 mg/dL   Calcium 8.8 (L) 8.9 - 10.3 mg/dL   Total Protein 7.2 6.5 - 8.1 g/dL   Albumin 3.9 3.5 - 5.0 g/dL   AST 26 15 - 41 U/L   ALT 19 0 - 44 U/L   Alkaline Phosphatase 93 38 - 126 U/L   Total Bilirubin 0.8 0.3 - 1.2 mg/dL   GFR, Estimated >75 >64 mL/min   Anion gap 9 5 - 15  CBC  Result Value Ref Range   WBC 4.6 4.0 - 10.5 K/uL   RBC 4.22 3.87 - 5.11 MIL/uL   Hemoglobin 12.2 12.0 - 15.0 g/dL   HCT 33.2 95.1 - 88.4 %   MCV 91.9 80.0 - 100.0 fL   MCH 28.9 26.0 - 34.0 pg   MCHC 31.4 30.0 - 36.0 g/dL   RDW 16.6 06.3 - 01.6 %   Platelets 223 150 - 400 K/uL   nRBC 0.0 0.0 - 0.2 %  hCG, quantitative, pregnancy  Result Value Ref Range   hCG, Beta Chain, Quant, S 3 <5 mIU/mL   CT Head Wo Contrast  Result Date: 08/04/2020 CLINICAL DATA:  Multiple sclerosis. Patient complaining of nausea, numbness in legs, and dizziness that started today. Patient brought in by [facility] Repeats scans for pt motion. EXAM: CT HEAD WITHOUT CONTRAST TECHNIQUE: Contiguous axial images were obtained from the base of the skull through the vertex without intravenous contrast. COMPARISON:  None. FINDINGS: Brain: Patchy areas of decreased attenuation are noted along periventricular white matter of the cerebral hemispheres bilaterally. No evidence of large-territorial acute infarction. No parenchymal hemorrhage. No mass lesion. No extra-axial collection. Cavum septum pellucidum variant. No mass effect or midline shift. No hydrocephalus. Basilar cisterns are patent. Vascular: No hyperdense vessel. Skull: No acute fracture or focal lesion. Sinuses/Orbits: Paranasal sinuses and mastoid air cells are clear. The orbits are unremarkable. Other: None. IMPRESSION: No acute intracranial abnormality. Electronically  Signed   By: Tish Frederickson M.D.   On: 08/04/2020 03:24     Radiology CT Head Wo Contrast  Result Date: 08/04/2020 CLINICAL DATA:  Multiple sclerosis. Patient complaining of nausea, numbness in legs, and dizziness that started today. Patient brought in by [facility] Repeats scans for pt motion. EXAM: CT HEAD WITHOUT CONTRAST TECHNIQUE: Contiguous axial images were  obtained from the base of the skull through the vertex without intravenous contrast. COMPARISON:  None. FINDINGS: Brain: Patchy areas of decreased attenuation are noted along periventricular white matter of the cerebral hemispheres bilaterally. No evidence of large-territorial acute infarction. No parenchymal hemorrhage. No mass lesion. No extra-axial collection. Cavum septum pellucidum variant. No mass effect or midline shift. No hydrocephalus. Basilar cisterns are patent. Vascular: No hyperdense vessel. Skull: No acute fracture or focal lesion. Sinuses/Orbits: Paranasal sinuses and mastoid air cells are clear. The orbits are unremarkable. Other: None. IMPRESSION: No acute intracranial abnormality. Electronically Signed   By: Tish Frederickson M.D.   On: 08/04/2020 03:24    Procedures Procedures   Medications Ordered in ED Medications  potassium chloride SA (KLOR-CON) CR tablet 40 mEq (has no administration in time range)    ED Course  I have reviewed the triage vital signs and the nursing notes.  Pertinent labs & imaging results that were available during my care of the patient were reviewed by me and considered in my medical decision making (see chart for details).  345 Case d/w Dr. Wilford Corner of neurology.  MRI of brain and c spine, contact am neurologist with findings for further guidance.    MRIs ordered by me   Final Clinical Impression(s) / ED Diagnoses Final diagnoses:  Nausea   Signed out to Dr. Effie Shy pending MRIs and call to neurology   Rx / DC Orders ED Discharge Orders     None          Seiji Wiswell,  MD 08/04/20 1157

## 2020-08-04 NOTE — ED Provider Notes (Addendum)
10:04 AM-radiographic readings of MRI brain and cervical spine have returned.  Chronic demyelinating disease without active demyelination per report.  Patient's current complaint is numbness in feet for 1 day.  She was dizzy earlier but is no longer dizzy.  She states that when she gets like that she typically needs intravenous steroids.  She is visiting from out of town and does not have a Insurance claims handler provider.  She last saw her neurologist about 11 months ago.  At this time the patient is taking her usual morning medications.  She has full supply of all of her medications.  Will discuss with neuro hospitalist.  10:34 AM -Case discussed with the neuro hospitalist who states that the patient does not need steroids at this time based on nonacute MRIs.  I have informed the patient and she states she is going to call a friend of hers, who she came to stay with, to come and get her.     Mancel Bale, MD 08/04/20 1036

## 2020-08-04 NOTE — ED Notes (Signed)
Patient transported to MRI
